# Patient Record
Sex: Female | Born: 2000
Health system: Southern US, Community
[De-identification: ages and names within clinical notes are randomized; demographics above are authoritative.]

## PROBLEM LIST (undated history)

## (undated) DIAGNOSIS — F329 Major depressive disorder, single episode, unspecified: Secondary | ICD-10-CM

## (undated) DIAGNOSIS — K219 Gastro-esophageal reflux disease without esophagitis: Secondary | ICD-10-CM

## (undated) DIAGNOSIS — J329 Chronic sinusitis, unspecified: Secondary | ICD-10-CM

## (undated) DIAGNOSIS — F32A Depression, unspecified: Secondary | ICD-10-CM

## (undated) DIAGNOSIS — R1310 Dysphagia, unspecified: Secondary | ICD-10-CM

## (undated) DIAGNOSIS — K589 Irritable bowel syndrome without diarrhea: Secondary | ICD-10-CM

## (undated) DIAGNOSIS — T7840XA Allergy, unspecified, initial encounter: Secondary | ICD-10-CM

## (undated) DIAGNOSIS — L309 Dermatitis, unspecified: Secondary | ICD-10-CM

## (undated) DIAGNOSIS — G43909 Migraine, unspecified, not intractable, without status migrainosus: Secondary | ICD-10-CM

## (undated) DIAGNOSIS — F0781 Postconcussional syndrome: Secondary | ICD-10-CM

## (undated) DIAGNOSIS — R51 Headache: Secondary | ICD-10-CM

## (undated) DIAGNOSIS — F419 Anxiety disorder, unspecified: Secondary | ICD-10-CM

## (undated) HISTORY — DX: Major depressive disorder, single episode, unspecified: F32.9

## (undated) HISTORY — DX: Depression, unspecified: F32.A

## (undated) HISTORY — DX: Dermatitis, unspecified: L30.9

## (undated) HISTORY — DX: Anxiety disorder, unspecified: F41.9

## (undated) HISTORY — DX: Chronic sinusitis, unspecified: J32.9

## (undated) HISTORY — DX: Postconcussional syndrome: F07.81

---
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
## 2019-03-10 ENCOUNTER — Other Ambulatory Visit: Payer: Self-pay | Admitting: Obstetrics & Gynecology

## 2019-05-01 ENCOUNTER — Emergency Department (INDEPENDENT_AMBULATORY_CARE_PROVIDER_SITE_OTHER)
Admission: EM | Admit: 2019-05-01 | Discharge: 2019-05-01 | Disposition: A | Discharge status: Home / Self Care | Payer: 59 | Source: Home / Self Care | Attending: Emergency Medicine | Admitting: Emergency Medicine

## 2019-05-01 ENCOUNTER — Encounter: Payer: Self-pay | Admitting: *Deleted

## 2019-05-01 ENCOUNTER — Other Ambulatory Visit: Payer: Self-pay

## 2019-05-01 DIAGNOSIS — H65111 Acute and subacute allergic otitis media (mucoid) (sanguinous) (serous), right ear: Secondary | ICD-10-CM

## 2019-05-01 DIAGNOSIS — J0101 Acute recurrent maxillary sinusitis: Secondary | ICD-10-CM | POA: Diagnosis not present

## 2019-05-01 MED ORDER — AMOXICILLIN-POT CLAVULANATE 875-125 MG PO TABS: 1.0000 | ORAL_TABLET | Freq: Two times a day (BID) | ORAL | 0 refills | Status: DC

## 2019-05-01 MED ORDER — FLUTICASONE PROPIONATE 50 MCG/ACT NA SUSP: NASAL | 0 refills | Status: DC

## 2019-05-01 NOTE — Discharge Instructions (Signed)
You have a sinus infection and the beginning of right ear infection.  Also related to seasonal allergies. I prescribed Augmentin which is a combination amoxicillin and clavulanate as antibiotic for the sinus and ear infection. Also prescribed Flonase 1 to 2 sprays each nostril twice a day.  Okay to continue Allegra and Sudafed over-the-counter if needed. Follow-up pcp in 10 to 14 days if no better, sooner if worse. Please read attached instruction sheet on sinusitis

## 2019-05-01 NOTE — ED Triage Notes (Signed)
Pt c/o bilateral ear ache and nasal congestion x 1 wk. She has taken Allegra and Sudafed.

## 2019-05-01 NOTE — ED Provider Notes (Signed)
[facility]    CSN: [id number] Arrival date & time: 05/01/19  1438      History   Chief Complaint Chief Complaint  Patient presents with   Otalgia   Nasal Congestion    HPI Tanya Pham is a 18 y.o. female.   HPI  SINUSITIS  Onset: 14 days Facial/sinus pressure with discolored nasal mucus.    Severity: moderate,Worsening Tried OTC meds without significant relief.  Symptoms:  + Fever  + URI prodrome with nasal congestion + Minimal swollen neck glands + mild Sinus Headache + R ear pressure  + Season Allergy symptoms No significant Sore Throat No eye symptoms     No significant Cough No chest pain No shortness of breath  No wheezing  No Abdominal Pain No Nausea No Vomiting No diarrhea  No Myalgias No focal neurologic symptoms No syncope No Rash  No Urinary symptoms  Remainder of Review of Systems negative except as noted in the HPI.  Past Medical History:  Diagnosis Date   Allergy    seasonal   Anxiety    Depression    Dysphagia    trouble swallowing last 2 months   Eczema    childhood   GERD (gastroesophageal reflux disease)    Headache(784.0)    Migraines    Post concussive syndrome    Sinusitis     Patient Active Problem List   Diagnosis Date Noted   Chronic tension-type headache, not intractable 06/02/2014   Postconcussional syndrome 03/11/2014   Dizzy spells 03/11/2014   Headache(784.0) 03/09/2014   Dysphagia, unspecified(787.20) 01/26/2014   Chest pain 01/15/2014   Epigastric pain 01/15/2014    Past Surgical History:  Procedure Laterality Date   BALLOON DILATION N/A 02/06/2014   Procedure: BALLOON DILATION;  Surgeon: Iva Booparl E [name], MD;  Location: [facility];  Service: Endoscopy;  Laterality: N/A;   ESOPHAGOGASTRODUODENOSCOPY N/A 02/06/2014   Procedure: ESOPHAGOGASTRODUODENOSCOPY (EGD);  Surgeon: Iva Booparl E [name], MD;  [facility];  Service: Endoscopy;  Laterality: N/A;    OB  History    Gravida  0   Para  0   Term  0   Preterm  0   AB  0   Living  0     SAB  0   TAB  0   Ectopic  0   Multiple  0   Live Births  0            Home Medications    Prior to Admission medications   Medication Sig Start Date End Date Taking? Authorizing Provider  colestipol (COLESTID) 1 g tablet Take by mouth. 02/25/19  Yes [provider]  amoxicillin-clavulanate (AUGMENTIN) 875-125 MG tablet Take 1 tablet by mouth 2 (two) times daily. For 10 days. Take with food. 05/01/19   Lajean ManesMassey, Marili Vader, MD  esomeprazole (NEXIUM) 20 MG capsule Take 20 mg by mouth daily at 12 noon.    [provider]  fluticasone Aleda Grana(FLONASE) 50 MCG/ACT nasal spray 1 or 2 sprays each nostril twice a day 05/01/19   Lajean ManesMassey, Shaiden Aldous, MD  loperamide (IMODIUM) 2 MG capsule Take 2 mg by mouth as needed for diarrhea or loose stools.    [provider]  Norethindrone Acetate-Ethinyl Estrad-FE (BLISOVI 24 FE) 1-20 MG-MCG(24) tablet Take 1 tablet by mouth daily.    [provider]  ondansetron (ZOFRAN) 4 MG tablet Take one-two tablets every 8 hours as needed for diarrhea 03/20/18   Esterwood, Amy S, PA-C  OVER THE COUNTER MEDICATION  Lactaid tablets, as needed.    [provider]  OVER THE COUNTER MEDICATION Take 1 tablet by mouth daily.     [provider]    Family History Family History  Problem Relation Age of Onset   Diabetes Mother    Lymphoma Father    Migraines Maternal Grandmother    Esophageal cancer Neg Hx    Colon cancer Neg Hx    Stomach cancer Neg Hx    Liver cancer Neg Hx    Pancreatic cancer Neg Hx    Rectal cancer Neg Hx     Social History Social History   Tobacco Use   Smoking status: Never Smoker   Smokeless tobacco: Never Used  Substance Use Topics   Alcohol use: No   Drug use: No     Allergies   Patient has no known allergies.   Review of Systems Review of Systems  All other systems reviewed and are  negative.    Physical Exam Triage Vital Signs ED Triage Vitals  Enc Vitals Group     BP 05/01/19 1518 111/72     Pulse Rate 05/01/19 1518 85     Resp 05/01/19 1518 16     Temp 05/01/19 1518 98.4 F (36.9 C)     Temp Source 05/01/19 1518 Oral     SpO2 05/01/19 1518 99 %     Weight 05/01/19 1519 160 lb (72.6 kg)     Height 05/01/19 1519 5\' 7"  (1.702 m)     Head Circumference --      Peak Flow --      Pain Score 05/01/19 1519 0     Pain Loc --      Pain Edu? --      Excl. in [place]? --    No data found.  Updated Vital Signs BP 111/72 (BP Location: Right Arm)    Pulse 85    Temp 98.4 F (36.9 C) (Oral)    Resp 16    Ht 5\' 7"  (1.702 m)    Wt 72.6 kg    LMP 04/10/2019    SpO2 99%    BMI 25.06 kg/m   Visual Acuity Right Eye Distance:   Left Eye Distance:   Bilateral Distance:    Right Eye Near:   Left Eye Near:    Bilateral Near:     Physical Exam Vitals signs and nursing note reviewed.  Constitutional:      General: She is not in acute distress.    Appearance: She is well-developed.  HENT:     Head: Normocephalic and atraumatic.     Right Ear: Ear canal and external ear normal. Tympanic membrane is erythematous and retracted. Tympanic membrane is not perforated.     Left Ear: Tympanic membrane, ear canal and external ear normal.     Nose: Mucosal edema and rhinorrhea present.     Right Sinus: Maxillary sinus tenderness present.     Left Sinus: Maxillary sinus tenderness present.     Mouth/Throat:     Mouth: No oral lesions.     Pharynx: No oropharyngeal exudate.  Eyes:     General: No scleral icterus.       Right eye: No discharge.        Left eye: No discharge.     Conjunctiva/sclera: Conjunctivae normal.  Neck:     Musculoskeletal: Neck supple.  Cardiovascular:     Rate and Rhythm: Normal rate and regular rhythm.     Heart sounds:  Normal heart sounds.  Pulmonary:     Effort: Pulmonary effort is normal.     Breath sounds: Normal breath sounds. No wheezing or  rales.  Lymphadenopathy:     Cervical: No cervical adenopathy.  Skin:    General: Skin is warm and dry.  Neurological:     Mental Status: She is alert and oriented to person, place, and time.      UC Treatments / Results  Labs (all labs ordered are listed, but only abnormal results are displayed) Labs Reviewed - No data to display  EKG   Radiology No results found.  Procedures Procedures (including critical care time)  Medications Ordered in UC Medications - No data to display  Initial Impression / Assessment and Plan / UC Course  I have reviewed the triage vital signs and the nursing notes.  Pertinent labs & imaging results that were available during my care of the patient were reviewed by me and considered in my medical decision making (see chart for details).      Final Clinical Impressions(s) / UC Diagnoses   Final diagnoses:  Acute recurrent maxillary sinusitis  Acute mucoid otitis media of right ear     Discharge Instructions     You have a sinus infection and the beginning of right ear infection.  Also related to seasonal allergies. I prescribed Augmentin which is a combination amoxicillin and clavulanate as antibiotic for the sinus and ear infection. Also prescribed Flonase 1 to 2 sprays each nostril twice a day.  Okay to continue Allegra and Sudafed over-the-counter if needed. Follow-up pcp in 10 to 14 days if no better, sooner if worse. Please read attached instruction sheet on sinusitis    ED Prescriptions    Medication Sig Dispense Auth. Provider   amoxicillin-clavulanate (AUGMENTIN) 875-125 MG tablet Take 1 tablet by mouth 2 (two) times daily. For 10 days. Take with food. 20 tablet Jacqulyn Cane, MD   fluticasone [facility]) 50 MCG/ACT nasal spray 1 or 2 sprays each nostril twice a day 16 g Jacqulyn Cane, MD     Controlled Substance Prescriptions [place] Controlled Substance Registry consulted? Not Applicable   Jacqulyn Cane, MD 05/01/19 1950

## 2019-10-29 ENCOUNTER — Emergency Department (INDEPENDENT_AMBULATORY_CARE_PROVIDER_SITE_OTHER)
Admission: EM | Admit: 2019-10-29 | Discharge: 2019-10-29 | Disposition: A | Discharge status: Home / Self Care | Payer: 59 | Source: Home / Self Care

## 2019-10-29 ENCOUNTER — Encounter: Payer: Self-pay | Admitting: Emergency Medicine

## 2019-10-29 ENCOUNTER — Other Ambulatory Visit: Payer: Self-pay

## 2019-10-29 DIAGNOSIS — R0981 Nasal congestion: Secondary | ICD-10-CM

## 2019-10-29 DIAGNOSIS — R519 Headache, unspecified: Secondary | ICD-10-CM

## 2019-10-29 MED ORDER — PREDNISONE 20 MG PO TABS: ORAL_TABLET | ORAL | 0 refills | Status: DC

## 2019-10-29 NOTE — ED Triage Notes (Signed)
C/o sinus pressure, bilat ear pain and "swimmy head" x 3 days No relief w/ claritan or excedrin @ 1330 Uses neti-pot at home Work had requested a rapid covid screen - done at "[facility]" in [place] -done today - negative per pt Pt had covid 08/01/19

## 2019-10-29 NOTE — Discharge Instructions (Signed)
  Please take the prednisone as prescribed to help with inflammation in your sinuses and help with pain.  Stop taking sudafed as it can cause dizziness and dry out your sinuses too much, worsening headaches.  You may take 500mg  acetaminophen every 4-6 hours or in combination with ibuprofen 400-600mg  every 6-8 hours as needed for pain, inflammation, and fever.  Be sure to well hydrated with clear liquids and get at least 8 hours of sleep at night, preferably more while sick.   Please follow up with family medicine in 1 week if needed.

## 2019-10-29 NOTE — ED Provider Notes (Signed)
[facility]    CSN: [id number] Arrival date & time: 10/29/19  1610      History   Chief Complaint Chief Complaint  Patient presents with  . Headache  . Facial Pain  . Otalgia    bilat    HPI Tanya Pham is a 19 y.o. female.   HPI  Tanya Pham is a 19 y.o. female presenting to UC with c/o 3 days of sinus congestion, sinus pressure, bilateral ear fullness and temporal HA.  She also reports feeling "swimmy headed" at times.  Work asked her to leave early this morning to get a Covid test.  She was seen at a different urgent care. The rapid test was negative so she went back to work.  She has to look at a computer at work, which made her headache a lot worse so she went home.  She has tried sudafed and excedrin w/o relief of HA.  She also uses a generic form of Flonase and neti pots.  Denies fever, chills, n/v/d. No known sick contacts. She did test positive for Covid 08/01/2019.  Symptoms had resolved completely from that illness.  She has done well with prednisone in the past for sinus headaches.    Past Medical History:  Diagnosis Date  . Allergy    seasonal  . Anxiety   . Depression   . Dysphagia    trouble swallowing last 2 months  . Eczema    childhood  . GERD (gastroesophageal reflux disease)   . Headache(784.0)   . Migraines   . Post concussive syndrome   . Sinusitis     Patient Active Problem List   Diagnosis Date Noted  . Chronic tension-type headache, not intractable 06/02/2014  . Postconcussional syndrome 03/11/2014  . Dizzy spells 03/11/2014  . Headache(784.0) 03/09/2014  . Dysphagia, unspecified(787.20) 01/26/2014  . Chest pain 01/15/2014  . Epigastric pain 01/15/2014    Past Surgical History:  Procedure Laterality Date  . BALLOON DILATION N/A 02/06/2014   Procedure: BALLOON DILATION;  Surgeon: Iva Boop, MD;  Location: [facility];  Service: Endoscopy;  Laterality: N/A;  . ESOPHAGOGASTRODUODENOSCOPY N/A 02/06/2014   Procedure: ESOPHAGOGASTRODUODENOSCOPY (EGD);  Surgeon: Iva Boop, MD;  [facility];  Service: Endoscopy;  Laterality: N/A;    OB History    Gravida  0   Para  0   Term  0   Preterm  0   AB  0   Living  0     SAB  0   TAB  0   Ectopic  0   Multiple  0   Live Births  0            Home Medications    Prior to Admission medications   Medication Sig Start Date End Date Taking? Authorizing Provider  fluticasone (FLONASE) 50 MCG/ACT nasal spray 1 or 2 sprays each nostril twice a day 05/01/19  Yes Lajean Manes, MD  pantoprazole (PROTONIX) 40 MG tablet Take by mouth. 10/17/19  Yes [provider]  amoxicillin-clavulanate (AUGMENTIN) 875-125 MG tablet Take 1 tablet by mouth 2 (two) times daily. For 10 days. Take with food. 05/01/19   Lajean Manes, MD  colestipol (COLESTID) 1 g tablet Take by mouth. 02/25/19   [provider]  esomeprazole (NEXIUM) 20 MG capsule Take 20 mg by mouth daily at 12 noon.    [provider]  loperamide (IMODIUM) 2 MG capsule Take 2 mg by mouth as needed for diarrhea  or loose stools.    [provider]  Norethindrone Acetate-Ethinyl Estrad-FE (BLISOVI 24 FE) 1-20 MG-MCG(24) tablet Take 1 tablet by mouth daily.    [provider]  ondansetron (ZOFRAN) 4 MG tablet Take one-two tablets every 8 hours as needed for diarrhea 03/20/18   Esterwood, Amy S, PA-C  OVER THE COUNTER MEDICATION Lactaid tablets, as needed.    [provider]  OVER THE COUNTER MEDICATION Take 1 tablet by mouth daily.     [provider]  predniSONE (DELTASONE) 20 MG tablet 3 tabs po day one, then 2 po daily x 4 days 10/29/19   Noe Gens, PA-C    Family History Family History  Problem Relation Age of Onset  . Diabetes Mother   . Lymphoma Father   . Migraines Maternal Grandmother   . Esophageal cancer Neg Hx   . Colon cancer Neg Hx   . Stomach cancer Neg Hx   . Liver cancer Neg Hx   . Pancreatic  cancer Neg Hx   . Rectal cancer Neg Hx     Social History Social History   Tobacco Use  . Smoking status: Never Smoker  . Smokeless tobacco: Never Used  Substance Use Topics  . Alcohol use: No  . Drug use: No     Allergies   Patient has no known allergies.   Review of Systems Review of Systems  Constitutional: Negative for chills and fever.  HENT: Positive for congestion, ear pain ( fullness) and sinus pressure. Negative for sore throat, trouble swallowing and voice change.   Respiratory: Negative for cough and shortness of breath.   Cardiovascular: Negative for chest pain and palpitations.  Gastrointestinal: Negative for abdominal pain, diarrhea, nausea and vomiting.  Musculoskeletal: Negative for arthralgias, back pain and myalgias.  Skin: Negative for rash.  Neurological: Positive for headaches. Negative for dizziness and light-headedness.     Physical Exam Triage Vital Signs ED Triage Vitals  Enc Vitals Group     BP 10/29/19 1626 132/84     Pulse Rate 10/29/19 1626 94     Resp 10/29/19 1626 17     Temp 10/29/19 1626 99 F (37.2 C)     Temp Source 10/29/19 1626 Oral     SpO2 10/29/19 1626 99 %     Weight 10/29/19 1628 155 lb (70.3 kg)     Height 10/29/19 1628 5\' 7"  (1.702 m)     Head Circumference --      Peak Flow --      Pain Score 10/29/19 1627 7     Pain Loc --      Pain Edu? --      Excl. in [place]? --    No data found.  Updated Vital Signs BP 132/84 (BP Location: Left Arm)   Pulse 94   Temp 99 F (37.2 C) (Oral)   Resp 17   Ht 5\' 7"  (1.702 m)   Wt 155 lb (70.3 kg)   LMP 10/15/2019 (Approximate)   SpO2 99%   BMI 24.28 kg/m   Visual Acuity Right Eye Distance:   Left Eye Distance:   Bilateral Distance:    Right Eye Near:   Left Eye Near:    Bilateral Near:     Physical Exam Vitals and nursing note reviewed.  Constitutional:      General: She is not in acute distress.    Appearance: She is well-developed. She is not ill-appearing,  toxic-appearing or diaphoretic.  HENT:  Head: Normocephalic and atraumatic.     Right Ear: Tympanic membrane and ear canal normal.     Left Ear: Tympanic membrane and ear canal normal.     Nose: Mucosal edema present.     Right Sinus: No maxillary sinus tenderness or frontal sinus tenderness.     Left Sinus: No maxillary sinus tenderness or frontal sinus tenderness.     Comments: Mild tenderness to temporal sides of head but not over temporal arteries. Arteries are soft, non-pulsating     Mouth/Throat:     Lips: Pink.     Mouth: Mucous membranes are moist.     Pharynx: Oropharynx is clear. Uvula midline.  Eyes:     Extraocular Movements: Extraocular movements intact.     Pupils: Pupils are equal, round, and reactive to light.  Cardiovascular:     Rate and Rhythm: Normal rate and regular rhythm.  Pulmonary:     Effort: Pulmonary effort is normal. No respiratory distress.     Breath sounds: Normal breath sounds. No stridor. No wheezing, rhonchi or rales.  Musculoskeletal:        General: Normal range of motion.     Cervical back: Normal range of motion and neck supple.  Skin:    General: Skin is warm and dry.  Neurological:     Mental Status: She is alert and oriented to person, place, and time.     Cranial Nerves: No cranial nerve deficit.  Psychiatric:        Mood and Affect: Mood normal.        Behavior: Behavior normal.      UC Treatments / Results  Labs (all labs ordered are listed, but only abnormal results are displayed) Labs Reviewed - No data to display  EKG   Radiology No results found.  Procedures Procedures (including critical care time)  Medications Ordered in UC Medications - No data to display  Initial Impression / Assessment and Plan / UC Course  I have reviewed the triage vital signs and the nursing notes.  Pertinent labs & imaging results that were available during my care of the patient were reviewed by me and considered in my medical  decision making (see chart for details).      No evidence of bacterial infection at this time. Encouraged symptomatic tx Pt states she has done well with prednisone in the past Encouraged good hydration and rest.  AVS and work note provided  Final Clinical Impressions(s) / UC Diagnoses   Final diagnoses:  Bad headache  Sinus congestion     Discharge Instructions      Please take the prednisone as prescribed to help with inflammation in your sinuses and help with pain.  Stop taking sudafed as it can cause dizziness and dry out your sinuses too much, worsening headaches.  You may take 500mg  acetaminophen every 4-6 hours or in combination with ibuprofen 400-600mg  every 6-8 hours as needed for pain, inflammation, and fever.  Be sure to well hydrated with clear liquids and get at least 8 hours of sleep at night, preferably more while sick.   Please follow up with family medicine in 1 week if needed.     ED Prescriptions    Medication Sig Dispense Auth. Provider   predniSONE (DELTASONE) 20 MG tablet 3 tabs po day one, then 2 po daily x 4 days 11 tablet , PA-C     PDMP not reviewed this encounter.   Lurene Shadow, Lurene Shadow 10/29/19 [id number]

## 2020-03-09 ENCOUNTER — Other Ambulatory Visit: Payer: Self-pay

## 2020-03-09 ENCOUNTER — Emergency Department (INDEPENDENT_AMBULATORY_CARE_PROVIDER_SITE_OTHER)
Admission: EM | Admit: 2020-03-09 | Discharge: 2020-03-09 | Disposition: A | Discharge status: Home / Self Care | Payer: 59 | Source: Home / Self Care

## 2020-03-09 ENCOUNTER — Encounter: Payer: Self-pay | Admitting: Emergency Medicine

## 2020-03-09 DIAGNOSIS — H65113 Acute and subacute allergic otitis media (mucoid) (sanguinous) (serous), bilateral: Secondary | ICD-10-CM | POA: Diagnosis not present

## 2020-03-09 MED ORDER — AMOXICILLIN-POT CLAVULANATE 875-125 MG PO TABS: 1.0000 | ORAL_TABLET | Freq: Two times a day (BID) | ORAL | 0 refills | Status: DC

## 2020-03-09 NOTE — ED Triage Notes (Signed)
Bilateral ear pain x 2 weeks -intermittent Min relief w/ allegra-D (last dose 1 week ago Pain worse on Right side  Pt has some mild sinus pressure  No other OTC meds No COVID vaccine

## 2020-03-09 NOTE — ED Provider Notes (Signed)
[facility]    CSN: [id number] Arrival date & time: 03/09/20  1054      History   Chief Complaint Chief Complaint  Patient presents with  . Otalgia    bilateral    HPI Tanya Pham is a 19 y.o. female.   Patient has a history of ear congestion and discomfort.  This is been a chronic problem.  She is also has some sinus pain and pressure.  HPI  Past Medical History:  Diagnosis Date  . Allergy    seasonal  . Anxiety   . Depression   . Dysphagia    trouble swallowing last 2 months  . Eczema    childhood  . GERD (gastroesophageal reflux disease)   . Headache(784.0)   . Migraines   . Post concussive syndrome   . Sinusitis     Patient Active Problem List   Diagnosis Date Noted  . Chronic tension-type headache, not intractable 06/02/2014  . Postconcussional syndrome 03/11/2014  . Dizzy spells 03/11/2014  . Headache(784.0) 03/09/2014  . Dysphagia, unspecified(787.20) 01/26/2014  . Chest pain 01/15/2014  . Epigastric pain 01/15/2014    Past Surgical History:  Procedure Laterality Date  . BALLOON DILATION N/A 02/06/2014   Procedure: BALLOON DILATION;  Surgeon: Iva Boop, MD;  Location: [facility];  Service: Endoscopy;  Laterality: N/A;  . ESOPHAGOGASTRODUODENOSCOPY N/A 02/06/2014   Procedure: ESOPHAGOGASTRODUODENOSCOPY (EGD);  Surgeon: Iva Boop, MD;  [facility];  Service: Endoscopy;  Laterality: N/A;    OB History    Gravida  0   Para  0   Term  0   Preterm  0   AB  0   Living  0     SAB  0   TAB  0   Ectopic  0   Multiple  0   Live Births  0            Home Medications    Prior to Admission medications   Medication Sig Start Date End Date Taking? Authorizing Provider  esomeprazole (NEXIUM) 20 MG capsule Take 20 mg by mouth daily at 12 noon.   Yes [provider]  pantoprazole (PROTONIX) 40 MG tablet Take by mouth. 10/17/19  Yes [provider]  amoxicillin-clavulanate  (AUGMENTIN) 875-125 MG tablet Take 1 tablet by mouth every 12 (twelve) hours. 03/09/20   Frederica Kuster, MD  amoxicillin-clavulanate (AUGMENTIN) 875-125 MG tablet Take 1 tablet by mouth 2 (two) times daily. For 10 days. Take with food. 03/09/20   Frederica Kuster, MD  colestipol (COLESTID) 1 g tablet Take by mouth. 02/25/19   [provider]  etonogestrel (NEXPLANON) 68 MG IMPL implant Inject into the skin.    [provider]  fluticasone Aleda Grana) 50 MCG/ACT nasal spray 1 or 2 sprays each nostril twice a day 05/01/19   Lajean Manes, MD  loperamide (IMODIUM) 2 MG capsule Take 2 mg by mouth as needed for diarrhea or loose stools.    [provider]  Norethindrone Acetate-Ethinyl Estrad-FE (BLISOVI 24 FE) 1-20 MG-MCG(24) tablet Take 1 tablet by mouth daily.    [provider]  ondansetron (ZOFRAN) 4 MG tablet Take one-two tablets every 8 hours as needed for diarrhea 03/20/18   Esterwood, Amy S, PA-C  OVER THE COUNTER MEDICATION Lactaid tablets, as needed.    [provider]  OVER THE COUNTER MEDICATION Take 1 tablet by mouth daily.     [provider]  predniSONE (DELTASONE) 20 MG  tablet 3 tabs po day one, then 2 po daily x 4 days 10/29/19   Lurene Shadow, PA-C    Family History Family History  Problem Relation Age of Onset  . Diabetes Mother   . Lymphoma Father   . Migraines Maternal Grandmother   . Esophageal cancer Neg Hx   . Colon cancer Neg Hx   . Stomach cancer Neg Hx   . Liver cancer Neg Hx   . Pancreatic cancer Neg Hx   . Rectal cancer Neg Hx     Social History Social History   Tobacco Use  . Smoking status: Never Smoker  . Smokeless tobacco: Never Used  Vaping Use  . Vaping Use: Never used  Substance Use Topics  . Alcohol use: No  . Drug use: No     Allergies   Patient has no known allergies.   Review of Systems Review of Systems  HENT: Positive for congestion, ear pain, sinus pressure and sinus pain.     Respiratory: Negative.   Cardiovascular: Negative.   All other systems reviewed and are negative.    Physical Exam Triage Vital Signs ED Triage Vitals  Enc Vitals Group     BP 03/09/20 1151 118/83     Pulse Rate 03/09/20 1151 75     Resp 03/09/20 1151 17     Temp 03/09/20 1151 99.1 F (37.3 C)     Temp Source 03/09/20 1151 Oral     SpO2 03/09/20 1151 100 %     Weight --      Height --      Head Circumference --      Peak Flow --      Pain Score 03/09/20 1154 4     Pain Loc --      Pain Edu? --      Excl. in [place]? --    No data found.  Updated Vital Signs BP 118/83 (BP Location: Right Arm)   Pulse 75   Temp 99.1 F (37.3 C) (Oral)   Resp 17   LMP 03/08/2020 (Exact Date) Comment: implant 2019  SpO2 100%   Visual Acuity Right Eye Distance:   Left Eye Distance:   Bilateral Distance:    Right Eye Near:   Left Eye Near:    Bilateral Near:     Physical Exam Vitals and nursing note reviewed.  Constitutional:      Appearance: Normal appearance.  HENT:     Head: Normocephalic.     Ears:     Comments: Both TMs are dull and do not move with Valsalva maneuver    Nose: Nose normal.     Mouth/Throat:     Mouth: Mucous membranes are moist.  Cardiovascular:     Rate and Rhythm: Normal rate and regular rhythm.  Pulmonary:     Effort: Pulmonary effort is normal.     Breath sounds: Normal breath sounds.  Neurological:     General: No focal deficit present.     Mental Status: She is alert and oriented to person, place, and time.      UC Treatments / Results  Labs (all labs ordered are listed, but only abnormal results are displayed) Labs Reviewed - No data to display  EKG   Radiology No results found.  Procedures Procedures (including critical care time)  Medications Ordered in UC Medications - No data to display  Initial Impression / Assessment and Plan / UC Course  I have reviewed the triage vital signs  and the nursing notes.  Pertinent labs &  imaging results that were available during my care of the patient were reviewed by me and considered in my medical decision making (see chart for details).     Bilateral serous effusion and possible sinusitis Final Clinical Impressions(s) / UC Diagnoses   Final diagnoses:  Acute mucoid otitis media of both ears     Discharge Instructions     Do ear exercises as discussed Use Flonase and both sides of nose once a day   ED Prescriptions    Medication Sig Dispense Auth. Provider   amoxicillin-clavulanate (AUGMENTIN) 875-125 MG tablet Take 1 tablet by mouth every 12 (twelve) hours. 14 tablet Frederica Kuster, MD   amoxicillin-clavulanate (AUGMENTIN) 875-125 MG tablet Take 1 tablet by mouth 2 (two) times daily. For 10 days. Take with food. 20 tablet Frederica Kuster, MD     PDMP not reviewed this encounter.   Frederica Kuster, MD 03/09/20 1220

## 2020-03-09 NOTE — Discharge Instructions (Addendum)
Do ear exercises as discussed Use Flonase and both sides of nose once a day

## 2020-04-14 ENCOUNTER — Other Ambulatory Visit: Payer: Self-pay

## 2020-04-14 ENCOUNTER — Ambulatory Visit (INDEPENDENT_AMBULATORY_CARE_PROVIDER_SITE_OTHER): Payer: 59 | Admitting: Obstetrics and Gynecology

## 2020-04-14 ENCOUNTER — Encounter: Payer: Self-pay | Admitting: Obstetrics and Gynecology

## 2020-04-14 VITALS — BP 104/64 | HR 90 | Resp 16 | Ht 67.0 in | Wt 184.0 lb

## 2020-04-14 DIAGNOSIS — Z30017 Encounter for initial prescription of implantable subdermal contraceptive: Secondary | ICD-10-CM | POA: Diagnosis not present

## 2020-04-14 DIAGNOSIS — Z3046 Encounter for surveillance of implantable subdermal contraceptive: Secondary | ICD-10-CM

## 2020-04-14 MED ORDER — ETONOGESTREL 68 MG ~~LOC~~ IMPL
68.0000 mg | DRUG_IMPLANT | Freq: Once | SUBCUTANEOUS | Status: AC
  Administered 2020-04-14: 68 mg via SUBCUTANEOUS

## 2020-04-14 NOTE — Progress Notes (Signed)
     GYNECOLOGY OFFICE PROCEDURE NOTE  Tanya Pham is a 19 y.o. G0P0000 here for Nexplanon removal and Nexplanon re- insertion.  Last pap smear was on NA- age.  No other gynecologic concerns.  Nexplanon Removal and Reinsertion Patient identified, informed consent performed, consent signed.   Patient does understand that irregular bleeding is a very common side effect of this medication. She was advised to have backup contraception for one week after replacement of the implant. Pregnancy test in clinic today was negative.  Appropriate time out taken. Nexplanon site identified in left arm.  Area prepped in usual sterile fashon. One ml of 1% lidocaine was used to anesthetize the area at the distal end of the implant. A small stab incision was made right beside the implant on the distal portion. The Nexplanon rod was grasped using hemostats and removed without difficulty. There was minimal blood loss. There were no complications. Area was then injected with 3 ml of 1 % lidocaine. She was re-prepped with betadine, Nexplanon removed from packaging, Device confirmed in needle, then inserted full length of needle and withdrawn per handbook instructions. Nexplanon was able to palpated in the patient's arm; patient palpated the insert herself.  There was minimal blood loss. Patient insertion site covered with gauze and a pressure bandage to reduce any bruising. The patient tolerated the procedure well and was given post procedure instructions.  She was advised to have backup contraception for one week.       Delesha Pohlman, Harolyn Rutherford, NP [facility] [employer], [facility]

## 2020-04-15 DIAGNOSIS — Z3046 Encounter for surveillance of implantable subdermal contraceptive: Secondary | ICD-10-CM | POA: Insufficient documentation

## 2020-06-18 ENCOUNTER — Telehealth: Payer: Self-pay

## 2020-06-18 NOTE — Telephone Encounter (Signed)
Patient left message stating that she just got another Nexplanon placed on 05/15/20 and in the past has had excessive bleeding in which she was prescribed medication.  Now that she has had the Nexplanon reinserted she is having the same issue.  Can someone call her back?  Addison Naegeli, RN

## 2020-06-21 ENCOUNTER — Emergency Department (INDEPENDENT_AMBULATORY_CARE_PROVIDER_SITE_OTHER)
Admission: RE | Admit: 2020-06-21 | Discharge: 2020-06-21 | Disposition: A | Discharge status: Home / Self Care | Payer: 59 | Source: Ambulatory Visit | Attending: Family Medicine | Admitting: Family Medicine

## 2020-06-21 ENCOUNTER — Other Ambulatory Visit: Payer: Self-pay

## 2020-06-21 VITALS — BP 135/97 | HR 88 | Temp 98.2°F | Resp 18

## 2020-06-21 DIAGNOSIS — N3001 Acute cystitis with hematuria: Secondary | ICD-10-CM

## 2020-06-21 DIAGNOSIS — R300 Dysuria: Secondary | ICD-10-CM

## 2020-06-21 HISTORY — DX: Irritable bowel syndrome, unspecified: K58.9

## 2020-06-21 LAB — POCT URINALYSIS DIP (MANUAL ENTRY)
Bilirubin, UA: NEGATIVE
Glucose, UA: NEGATIVE mg/dL
Ketones, POC UA: NEGATIVE mg/dL
Nitrite, UA: NEGATIVE
Protein Ur, POC: NEGATIVE mg/dL
Spec Grav, UA: 1.015 (ref 1.010–1.025)
Urobilinogen, UA: 0.2 E.U./dL
pH, UA: 5 (ref 5.0–8.0)

## 2020-06-21 MED ORDER — CEPHALEXIN 500 MG PO CAPS: 500.0000 mg | ORAL_CAPSULE | Freq: Two times a day (BID) | ORAL | 0 refills | Status: AC

## 2020-06-21 NOTE — ED Triage Notes (Signed)
Pt presents to Urgent Care with c/o dysuria and frequency since last night. She noted blood in toilet today, but she states she is finishing w/ her menstrual cycle.

## 2020-06-21 NOTE — ED Provider Notes (Signed)
[facility]    CSN: [id number] Arrival date & time: 06/21/20  1400      History   Chief Complaint Chief Complaint  Patient presents with  . Dysuria    HPI Tanya Pham is a 19 y.o. female.   HPI  Tanya Pham is a 19 y.o. female presenting to UC with c/o urinary frequency and burning since late last night.  She noticed blood in the toilet but is also finishing her period.  Denies fever, chills, n/v/d. Lower back pain but also attributes to her period. Hx of UTIs. No vaginal symptoms.    Past Medical History:  Diagnosis Date  . Allergy    seasonal  . Anxiety   . Depression   . Dysphagia    trouble swallowing last 2 months  . Eczema    childhood  . GERD (gastroesophageal reflux disease)   . Headache(784.0)   . IBS (irritable bowel syndrome)   . Migraines   . Post concussive syndrome   . Sinusitis     Patient Active Problem List   Diagnosis Date Noted  . Encounter for removal and reinsertion of Nexplanon 04/15/2020  . Chronic tension-type headache, not intractable 06/02/2014  . Postconcussional syndrome 03/11/2014  . Dizzy spells 03/11/2014  . Headache(784.0) 03/09/2014  . Dysphagia, unspecified(787.20) 01/26/2014  . Chest pain 01/15/2014  . Epigastric pain 01/15/2014    Past Surgical History:  Procedure Laterality Date  . BALLOON DILATION N/A 02/06/2014   Procedure: BALLOON DILATION;  Surgeon: Iva Boop, MD;  Location: [facility];  Service: Endoscopy;  Laterality: N/A;  . ESOPHAGOGASTRODUODENOSCOPY N/A 02/06/2014   Procedure: ESOPHAGOGASTRODUODENOSCOPY (EGD);  Surgeon: Iva Boop, MD;  Location: [facility];  Service: Endoscopy;  Laterality: N/A;    OB History    Gravida  0   Para  0   Term  0   Preterm  0   AB  0   Living  0     SAB  0   TAB  0   Ectopic  0   Multiple  0   Live Births  0            Home Medications    Prior to Admission medications   Medication Sig Start Date End Date Taking?  Authorizing Provider  cephALEXin (KEFLEX) 500 MG capsule Take 1 capsule (500 mg total) by mouth 2 (two) times daily for 7 days. 06/21/20 06/28/20  Lurene Shadow, PA-C  esomeprazole (NEXIUM) 20 MG capsule Take 20 mg by mouth daily at 12 noon.    [provider]  fluticasone Aleda Grana) 50 MCG/ACT nasal spray 1 or 2 sprays each nostril twice a day 05/01/19   Lajean Manes, MD  loperamide (IMODIUM) 2 MG capsule Take 2 mg by mouth as needed for diarrhea or loose stools.    [provider]  ondansetron (ZOFRAN) 4 MG tablet Take one-two tablets every 8 hours as needed for diarrhea 03/20/18   Esterwood, Amy S, PA-C  OVER THE COUNTER MEDICATION Lactaid tablets, as needed. Patient not taking: Reported on 04/14/2020    [provider]  OVER THE COUNTER MEDICATION Take 1 tablet by mouth daily.     [provider]  pantoprazole (PROTONIX) 40 MG tablet Take by mouth. 10/17/19   [provider]    Family History Family History  Problem Relation Age of Onset  . Diabetes Mother   . Lymphoma Father   . Migraines Maternal Grandmother   . Esophageal  cancer Neg Hx   . Colon cancer Neg Hx   . Stomach cancer Neg Hx   . Liver cancer Neg Hx   . Pancreatic cancer Neg Hx   . Rectal cancer Neg Hx     Social History Social History   Tobacco Use  . Smoking status: Never Smoker  . Smokeless tobacco: Never Used  Vaping Use  . Vaping Use: Never used  Substance Use Topics  . Alcohol use: No  . Drug use: No     Allergies   Patient has no known allergies.   Review of Systems Review of Systems  Constitutional: Negative for chills and fever.  Gastrointestinal: Negative for abdominal pain, diarrhea, nausea and vomiting.  Genitourinary: Positive for dysuria, frequency, hematuria and urgency.  Neurological: Negative for dizziness and headaches.     Physical Exam Triage Vital Signs ED Triage Vitals  Enc Vitals Group     BP 06/21/20 1435 (!) 135/97     Pulse Rate  06/21/20 1435 88     Resp 06/21/20 1435 18     Temp 06/21/20 1435 98.2 F (36.8 C)     Temp Source 06/21/20 1435 Oral     SpO2 06/21/20 1435 100 %     Weight --      Height --      Head Circumference --      Peak Flow --      Pain Score 06/21/20 1430 7     Pain Loc --      Pain Edu? --      Excl. in GC? --    No data found.  Updated Vital Signs BP (!) 135/97 (BP Location: Right Arm)   Pulse 88   Temp 98.2 F (36.8 C) (Oral)   Resp 18   LMP 06/11/2020 (Approximate)   SpO2 100%   Visual Acuity Right Eye Distance:   Left Eye Distance:   Bilateral Distance:    Right Eye Near:   Left Eye Near:    Bilateral Near:     Physical Exam Vitals and nursing note reviewed.  Constitutional:      General: She is not in acute distress.    Appearance: Normal appearance. She is well-developed. She is not ill-appearing, toxic-appearing or diaphoretic.  HENT:     Head: Normocephalic and atraumatic.  Cardiovascular:     Rate and Rhythm: Normal rate and regular rhythm.  Pulmonary:     Effort: Pulmonary effort is normal. No respiratory distress.     Breath sounds: Normal breath sounds.  Abdominal:     General: There is no distension.     Palpations: Abdomen is soft.     Tenderness: There is no abdominal tenderness. There is no right CVA tenderness or left CVA tenderness.  Musculoskeletal:        General: Normal range of motion.     Cervical back: Normal range of motion.  Skin:    General: Skin is warm and dry.  Neurological:     Mental Status: She is alert and oriented to person, place, and time.  Psychiatric:        Behavior: Behavior normal.      UC Treatments / Results  Labs (all labs ordered are listed, but only abnormal results are displayed) Labs Reviewed  POCT URINALYSIS DIP (MANUAL ENTRY) - Abnormal; Notable for the following components:      Result Value   Blood, UA moderate (*)    Leukocytes, UA Small (1+) (*)  All other components within normal limits    URINE CULTURE    EKG   Radiology No results found.  Procedures Procedures (including critical care time)  Medications Ordered in UC Medications - No data to display  Initial Impression / Assessment and Plan / UC Course  I have reviewed the triage vital signs and the nursing notes.  Pertinent labs & imaging results that were available during my care of the patient were reviewed by me and considered in my medical decision making (see chart for details).     Hx and UA c/w UTI Culture sent Will start on keflex F/u with PCP AVS given  Final Clinical Impressions(s) / UC Diagnoses   Final diagnoses:  Dysuria  Acute cystitis with hematuria     Discharge Instructions      Please take your antibiotic as prescribed. A urine culture has been sent to check the severity of your urinary infection and to determine if you are on the most appropriate antibiotic. The results should come back within 2-3 days. You will only be notified if a medication change is indicated.  Please follow up with family medicine or urology if not improving within 1 week, sooner if symptoms worsening.      ED Prescriptions    Medication Sig Dispense Auth. Provider   cephALEXin (KEFLEX) 500 MG capsule Take 1 capsule (500 mg total) by mouth 2 (two) times daily for 7 days. 14 capsule Lurene Shadow, [place]     PDMP not reviewed this encounter.   Lurene Shadow, PA-C 06/21/20 2003

## 2020-06-21 NOTE — Discharge Instructions (Signed)
  Please take your antibiotic as prescribed. A urine culture has been sent to check the severity of your urinary infection and to determine if you are on the most appropriate antibiotic. The results should come back within 2-3 days. You will only be notified if a medication change is indicated.  Please follow up with family medicine or urology if not improving within 1 week, sooner if symptoms worsening.   

## 2020-06-22 ENCOUNTER — Ambulatory Visit: Payer: 59 | Admitting: Certified Nurse Midwife

## 2020-06-24 LAB — URINE CULTURE
MICRO NUMBER:: 11146527
SPECIMEN QUALITY:: ADEQUATE

## 2020-06-30 ENCOUNTER — Other Ambulatory Visit: Payer: Self-pay

## 2020-06-30 ENCOUNTER — Emergency Department (INDEPENDENT_AMBULATORY_CARE_PROVIDER_SITE_OTHER)
Admission: EM | Admit: 2020-06-30 | Discharge: 2020-06-30 | Disposition: A | Discharge status: Home / Self Care | Payer: 59 | Source: Home / Self Care | Attending: Family Medicine | Admitting: Family Medicine

## 2020-06-30 DIAGNOSIS — Z9189 Other specified personal risk factors, not elsewhere classified: Secondary | ICD-10-CM

## 2020-06-30 DIAGNOSIS — J069 Acute upper respiratory infection, unspecified: Secondary | ICD-10-CM

## 2020-06-30 MED ORDER — PREDNISONE 20 MG PO TABS: ORAL_TABLET | ORAL | 0 refills | Status: DC

## 2020-06-30 NOTE — ED Triage Notes (Signed)
Pt presents to Urgent Care with c/o cough and nasal congestion x 2 days. Also c/o chills. Afebrile. Onset of N/V this AM. Pt w/ no known COVID exposure; has not been vaccinated.

## 2020-06-30 NOTE — Discharge Instructions (Addendum)
Continue Mucinex-D, with plenty of water, for cough and congestion.  Get adequate rest.   May use Afrin nasal spray (or generic oxymetazoline) each morning for about 5 days and then discontinue.  Also recommend using saline nasal spray several times daily and saline nasal irrigation (AYR is a common brand).  Use Flonase nasal spray each morning after using Afrin nasal spray and saline nasal irrigation. Try warm salt water gargles for sore throat.  Stop all antihistamines for now, and other non-prescription cough/cold preparations. May take Delsym Cough Suppressant ("12 Hour Cough Relief") at bedtime for nighttime cough.   Isolate yourself until COVID-19 test result is available.   If your COVID19 test is positive, then you are infected with the novel coronavirus and could give the virus to others.  Please continue isolation at home for at least 10 days since the start of your symptoms.  Once you complete your 10 day quarantine, you may return to normal activities as long as you've not had a fever for over 24 hours (without taking fever reducing medicine) and your symptoms are improving. Please continue good preventive care measures, including:  frequent hand-washing, avoid touching your face, cover coughs/sneezes, stay out of crowds and keep a 6 foot distance from others.  Go to the nearest hospital emergency room if fever/cough/breathlessness are severe or illness seems like a threat to life.

## 2020-06-30 NOTE — ED Provider Notes (Signed)
[facility]    CSN: [id number] Arrival date & time: 06/30/20  1228      History   Chief Complaint Chief Complaint  Patient presents with  . Cough  . Emesis    HPI Tanya Pham is a 19 y.o. female.   Three days ago patient developed mild sore throat, sinus congestion, and non-productive cough.  She has now developed fatigue, chills/sweats, mild vertigo with nausea, and earache today. She has a history of seasonal rhinitis (spring and fall).  The history is provided by the patient.    Past Medical History:  Diagnosis Date  . Allergy    seasonal  . Anxiety   . Depression   . Dysphagia    trouble swallowing last 2 months  . Eczema    childhood  . GERD (gastroesophageal reflux disease)   . Headache(784.0)   . IBS (irritable bowel syndrome)   . Migraines   . Post concussive syndrome   . Sinusitis     Patient Active Problem List   Diagnosis Date Noted  . Encounter for removal and reinsertion of Nexplanon 04/15/2020  . Chronic tension-type headache, not intractable 06/02/2014  . Postconcussional syndrome 03/11/2014  . Dizzy spells 03/11/2014  . Headache(784.0) 03/09/2014  . Dysphagia, unspecified(787.20) 01/26/2014  . Chest pain 01/15/2014  . Epigastric pain 01/15/2014    Past Surgical History:  Procedure Laterality Date  . BALLOON DILATION N/A 02/06/2014   Procedure: BALLOON DILATION;  Surgeon: Iva Boop, MD;  Location: [facility];  Service: Endoscopy;  Laterality: N/A;  . ESOPHAGOGASTRODUODENOSCOPY N/A 02/06/2014   Procedure: ESOPHAGOGASTRODUODENOSCOPY (EGD);  Surgeon: Iva Boop, MD;  Location: [facility];  Service: Endoscopy;  Laterality: N/A;    OB History    Gravida  0   Para  0   Term  0   Preterm  0   AB  0   Living  0     SAB  0   TAB  0   Ectopic  0   Multiple  0   Live Births  0            Home Medications    Prior to Admission medications   Medication Sig Start Date End Date Taking?  Authorizing Provider  OVER THE COUNTER MEDICATION "De-hist" OTC herbal treatment for cough/cold PRN   Yes [provider]  esomeprazole (NEXIUM) 20 MG capsule Take 20 mg by mouth daily at 12 noon.    [provider]  fluticasone Aleda Grana) 50 MCG/ACT nasal spray 1 or 2 sprays each nostril twice a day 05/01/19   Lajean Manes, MD  loperamide (IMODIUM) 2 MG capsule Take 2 mg by mouth as needed for diarrhea or loose stools.    [provider]  ondansetron (ZOFRAN) 4 MG tablet Take one-two tablets every 8 hours as needed for diarrhea 03/20/18   Esterwood, Amy S, PA-C  OVER THE COUNTER MEDICATION Lactaid tablets, as needed. Patient not taking: Reported on 04/14/2020    [provider]  OVER THE COUNTER MEDICATION Take 1 tablet by mouth daily.     [provider]  pantoprazole (PROTONIX) 40 MG tablet Take by mouth. 10/17/19   [provider]  predniSONE (DELTASONE) 20 MG tablet Take one tab by mouth twice daily for 4 days, then one daily for 3 days. Take with food. 06/30/20   Lattie Haw, MD    Family History Family History  Problem Relation Age of Onset  . Diabetes Mother   .  Lymphoma Father   . Migraines Maternal Grandmother   . Esophageal cancer Neg Hx   . Colon cancer Neg Hx   . Stomach cancer Neg Hx   . Liver cancer Neg Hx   . Pancreatic cancer Neg Hx   . Rectal cancer Neg Hx     Social History Social History   Tobacco Use  . Smoking status: Never Smoker  . Smokeless tobacco: Never Used  Vaping Use  . Vaping Use: Never used  Substance Use Topics  . Alcohol use: No  . Drug use: No     Allergies   Patient has no known allergies.   Review of Systems Review of Systems + sore throat + cough No pleuritic pain but has tightness in her anterior chest No wheezing + nasal congestion + post-nasal drainage No sinus pain/pressure No itchy/red eyes + earache + intermittent dizziness No hemoptysis No SOB No fever, +  chills/sweats + nausea No vomiting No abdominal pain No diarrhea No urinary symptoms No skin rash + fatigue No myalgias + headache Used OTC meds (Allegra, Mucinex D) without relief   Physical Exam Triage Vital Signs ED Triage Vitals  Enc Vitals Group     BP 06/30/20 1250 125/88     Pulse Rate 06/30/20 1250 95     Resp 06/30/20 1250 18     Temp 06/30/20 1250 98.3 F (36.8 C)     Temp src --      SpO2 06/30/20 1250 99 %     Weight --      Height --      Head Circumference --      Peak Flow --      Pain Score 06/30/20 1245 6     Pain Loc --      Pain Edu? --      Excl. in GC? --    No data found.  Updated Vital Signs BP 125/88 (BP Location: Right Arm)   Pulse 95   Temp 98.3 F (36.8 C)   Resp 18   LMP 06/11/2020 (Approximate)   SpO2 99%   Visual Acuity Right Eye Distance:   Left Eye Distance:   Bilateral Distance:    Right Eye Near:   Left Eye Near:    Bilateral Near:     Physical Exam Nursing notes and Vital Signs reviewed. Appearance:  Patient appears stated age, and in no acute distress Eyes:  Pupils are equal, round, and reactive to light and accomodation.  Extraocular movement is intact.  Conjunctivae are not inflamed  Ears:  Canals normal.  Tympanic membranes normal.  Nose:  Mildly congested turbinates.  No sinus tenderness.  Pharynx:  Normal Neck:  Supple.  Mildly enlarged tender lateral nodes are present bilaterally. Lungs:  Clear to auscultation.  Breath sounds are equal.  Moving air well. Heart:  Regular rate and rhythm without murmurs, rubs, or gallops.  Abdomen:  Nontender without masses or hepatosplenomegaly.  Bowel sounds are present.  No CVA or flank tenderness.  Extremities:  No edema.  Skin:  No rash present.   UC Treatments / Results  Labs (all labs ordered are listed, but only abnormal results are displayed) Labs Reviewed  NOVEL CORONAVIRUS, NAA    EKG   Radiology No results found.  Procedures Procedures (including  critical care time)  Medications Ordered in UC Medications - No data to display  Initial Impression / Assessment and Plan / UC Course  I have reviewed the triage vital signs and the  nursing notes.  Pertinent labs & imaging results that were available during my care of the patient were reviewed by me and considered in my medical decision making (see chart for details).    Benign exam.  There is no evidence of bacterial infection today. Because of history of allergic rhinitis, will begin prednisone burst/taper. COVID PCR pending. Followup with Family Doctor if not improved in about 10 days.   Final Clinical Impressions(s) / UC Diagnoses   Final diagnoses:  At increased risk of exposure to COVID-19 virus  Viral URI with cough     Discharge Instructions     Continue Mucinex-D, with plenty of water, for cough and congestion.  Get adequate rest.   May use Afrin nasal spray (or generic oxymetazoline) each morning for about 5 days and then discontinue.  Also recommend using saline nasal spray several times daily and saline nasal irrigation (AYR is a common brand).  Use Flonase nasal spray each morning after using Afrin nasal spray and saline nasal irrigation. Try warm salt water gargles for sore throat.  Stop all antihistamines for now, and other non-prescription cough/cold preparations. May take Delsym Cough Suppressant ("12 Hour Cough Relief") at bedtime for nighttime cough.   Isolate yourself until COVID-19 test result is available.   If your COVID19 test is positive, then you are infected with the novel coronavirus and could give the virus to others.  Please continue isolation at home for at least 10 days since the start of your symptoms.  Once you complete your 10 day quarantine, you may return to normal activities as long as you've not had a fever for over 24 hours (without taking fever reducing medicine) and your symptoms are improving. Please continue good preventive care measures,  including:  frequent hand-washing, avoid touching your face, cover coughs/sneezes, stay out of crowds and keep a 6 foot distance from others.  Go to the nearest hospital emergency room if fever/cough/breathlessness are severe or illness seems like a threat to life.     ED Prescriptions    Medication Sig Dispense Auth. Provider   predniSONE (DELTASONE) 20 MG tablet Take one tab by mouth twice daily for 4 days, then one daily for 3 days. Take with food. 11 tablet Lattie Haw, MD        Lattie Haw, MD 07/04/20 1250

## 2020-07-01 LAB — NOVEL CORONAVIRUS, NAA: SARS-CoV-2, NAA: NOT DETECTED

## 2020-07-01 LAB — SARS-COV-2, NAA 2 DAY TAT

## 2020-07-19 ENCOUNTER — Encounter: Payer: Self-pay | Admitting: Obstetrics & Gynecology

## 2020-07-19 ENCOUNTER — Ambulatory Visit (INDEPENDENT_AMBULATORY_CARE_PROVIDER_SITE_OTHER): Payer: 59 | Admitting: Obstetrics & Gynecology

## 2020-07-19 ENCOUNTER — Other Ambulatory Visit (HOSPITAL_COMMUNITY)
Admission: RE | Admit: 2020-07-19 | Discharge: 2020-07-19 | Disposition: A | Discharge status: Home / Self Care | Payer: 59 | Source: Ambulatory Visit | Attending: Obstetrics & Gynecology | Admitting: Obstetrics & Gynecology

## 2020-07-19 ENCOUNTER — Other Ambulatory Visit: Payer: Self-pay

## 2020-07-19 VITALS — BP 108/65 | HR 79 | Resp 16 | Ht 67.0 in | Wt 186.0 lb

## 2020-07-19 DIAGNOSIS — Z975 Presence of (intrauterine) contraceptive device: Secondary | ICD-10-CM

## 2020-07-19 DIAGNOSIS — N939 Abnormal uterine and vaginal bleeding, unspecified: Secondary | ICD-10-CM | POA: Insufficient documentation

## 2020-07-19 DIAGNOSIS — R5383 Other fatigue: Secondary | ICD-10-CM | POA: Insufficient documentation

## 2020-07-19 DIAGNOSIS — N921 Excessive and frequent menstruation with irregular cycle: Secondary | ICD-10-CM | POA: Insufficient documentation

## 2020-07-19 LAB — POCT URINE PREGNANCY: Preg Test, Ur: NEGATIVE

## 2020-07-19 MED ORDER — NATAZIA 3/2-2/2-3/1 MG PO TABS: 1.0000 | ORAL_TABLET | Freq: Every day | ORAL | 1 refills | Status: DC

## 2020-07-19 NOTE — Progress Notes (Signed)
   Subjective:    Patient ID: Monte Fantasia, female    DOB: Sep 12, 2000, 19 y.o.   MRN: [id number]  HPI  Pt presents for bleeding after new Nexplanon  Aug 25th Nexplanon changed Aug 29-Sept 7 Oct 12-Oct 31 Nov 12 Nov 16 Nov 22  Review of Systems  Constitutional: Negative.   Respiratory: Negative.   Cardiovascular: Negative.   Gastrointestinal: Negative.   Genitourinary: Positive for menstrual problem.  Psychiatric/Behavioral: Negative.        Objective:   Physical Exam Vitals reviewed.  Constitutional:      General: She is not in acute distress.    Appearance: She is well-developed.  HENT:     Head: Normocephalic and atraumatic.  Eyes:     Conjunctiva/sclera: Conjunctivae normal.  Cardiovascular:     Rate and Rhythm: Normal rate.  Pulmonary:     Effort: Pulmonary effort is normal.  Musculoskeletal:     Comments: Nexplanon in left arm (deeper than last Nexplanon but can be palpated.   Skin:    General: Skin is warm and dry.  Neurological:     Mental Status: She is alert and oriented to person, place, and time.    Vitals:   07/19/20 1347  BP: 108/65  Pulse: 79  Resp: 16  Weight: 186 lb (84.4 kg)  Height: 5\' 7"  (1.702 m)      Assessment & Plan:  19 yo female with bleeding on Nexplanon  1.  Similar to bleeding with first nexplanon.  Improved with brief course of OCP. 2.  Sexually active--uses condoms.  Will get STD screening 3.  Pap smear due March 2022 4.  CBC (felt very tired after bleeding)  30 mins spent reviewing records, exam, coordination of care and documentation.

## 2020-07-20 LAB — CBC
HCT: 38.7 % (ref 35.0–45.0)
Hemoglobin: 13.3 g/dL (ref 11.7–15.5)
MCH: 31.3 pg (ref 27.0–33.0)
MCHC: 34.4 g/dL (ref 32.0–36.0)
MCV: 91.1 fL (ref 80.0–100.0)
MPV: 11.9 fL (ref 7.5–12.5)
Platelets: 220 10*3/uL (ref 140–400)
RBC: 4.25 10*6/uL (ref 3.80–5.10)
RDW: 11.6 % (ref 11.0–15.0)
WBC: 6.6 10*3/uL (ref 3.8–10.8)

## 2020-07-20 LAB — URINE CYTOLOGY ANCILLARY ONLY
Chlamydia: NEGATIVE
Comment: NEGATIVE
Comment: NORMAL
Neisseria Gonorrhea: NEGATIVE

## 2020-12-01 ENCOUNTER — Ambulatory Visit (INDEPENDENT_AMBULATORY_CARE_PROVIDER_SITE_OTHER): Payer: 59 | Admitting: Obstetrics and Gynecology

## 2020-12-01 ENCOUNTER — Other Ambulatory Visit: Payer: Self-pay

## 2020-12-01 ENCOUNTER — Encounter: Payer: Self-pay | Admitting: Obstetrics and Gynecology

## 2020-12-01 VITALS — BP 117/70 | HR 105 | Ht 67.0 in | Wt 174.0 lb

## 2020-12-01 DIAGNOSIS — Z3046 Encounter for surveillance of implantable subdermal contraceptive: Secondary | ICD-10-CM | POA: Diagnosis not present

## 2020-12-01 MED ORDER — NATAZIA 3/2-2/2-3/1 MG PO TABS: 1.0000 | ORAL_TABLET | Freq: Every day | ORAL | 11 refills | Status: DC

## 2020-12-01 NOTE — Progress Notes (Signed)
     GYNECOLOGY OFFICE PROCEDURE NOTE  Tanya Pham is a 20 y.o. G0P0000 here for Nexplanon removal.  Last pap smear was on NA, due to schedule her first annual with pap.     Nexplanon Removal Patient identified, informed consent performed, consent signed.   Appropriate time out taken. Nexplanon site identified.  Area prepped in usual sterile fashon. One ml of 1% lidocaine was used to anesthetize the area at the distal end of the implant. A small stab incision was made right beside the implant on the distal portion.  The Nexplanon rod was grasped using hemostats and removed without difficulty.  There was minimal blood loss. There were no complications.  3 ml of 1% lidocaine was injected around the incision for post-procedure analgesia.  Steri-strips were applied over the small incision.  A pressure bandage was applied to reduce any bruising.  The patient tolerated the procedure well and was given post procedure instructions.  Patient is planning to use OCP's for contraception/attempt conception.   Erving Sassano, Harolyn Rutherford, NP [facility] for [employer], [facility]

## 2020-12-01 NOTE — Progress Notes (Signed)
Pt wants Nexplanon removed due to irregular bleeding. Has OCP Rx.

## 2022-03-21 NOTE — Progress Notes (Signed)
Last Mammogram: n/a Last Pap Smear:  never done Last Colon Screening;  n/a Seat Belts:   yes Sun Screen:   yes Dental Check Up:  yes Brush & Floss:  yes

## 2022-03-27 ENCOUNTER — Ambulatory Visit (INDEPENDENT_AMBULATORY_CARE_PROVIDER_SITE_OTHER): Payer: BC Managed Care – PPO | Admitting: Obstetrics & Gynecology

## 2022-03-27 ENCOUNTER — Encounter: Payer: Self-pay | Admitting: Obstetrics & Gynecology

## 2022-03-27 ENCOUNTER — Other Ambulatory Visit (HOSPITAL_COMMUNITY)
Admission: RE | Admit: 2022-03-27 | Discharge: 2022-03-27 | Disposition: A | Discharge status: Home / Self Care | Payer: BC Managed Care – PPO | Source: Ambulatory Visit | Attending: Obstetrics & Gynecology | Admitting: Obstetrics & Gynecology

## 2022-03-27 VITALS — BP 117/74 | HR 99 | Ht 67.0 in | Wt 187.0 lb

## 2022-03-27 DIAGNOSIS — Z01419 Encounter for gynecological examination (general) (routine) without abnormal findings: Secondary | ICD-10-CM | POA: Diagnosis present

## 2022-03-27 MED ORDER — NATAZIA 3/2-2/2-3/1 MG PO TABS: 1.0000 | ORAL_TABLET | Freq: Every day | ORAL | 11 refills | Status: DC

## 2022-03-27 NOTE — Progress Notes (Signed)
Subjective:     Tanya Pham is a 21 y.o. female here for a routine exam.  Current complaints: none.     Gynecologic History Patient's last menstrual period was 03/23/2022. Contraception: OCP (estrogen/progesterone) Last Pap: never.   Obstetric History OB History  Gravida Para Term Preterm AB Living  0 0 0 0 0 0  SAB IAB Ectopic Multiple Live Births  0 0 0 0 0     The following portions of the patient's history were reviewed and updated as appropriate: allergies, current medications, past family history, past medical history, past social history, past surgical history, and problem list.  Review of Systems Pertinent items noted in HPI and remainder of comprehensive ROS otherwise negative.    Objective:   Vitals:   03/27/22 1459  BP: 117/74  Pulse: 99  Weight: 187 lb (84.8 kg)  Height: 5\' 7"  (1.702 m)   Vitals:  WNL General appearance: alert, cooperative and no distress  HEENT: Normocephalic, without obvious abnormality, atraumatic Eyes: negative Throat: lips, mucosa, and tongue normal; teeth and gums normal  Respiratory: Clear to auscultation bilaterally  CV: Regular rate and rhythm  Breasts:  Normal appearance, no masses or tenderness, no nipple retraction or dimpling  GI: Soft, non-tender; bowel sounds normal; no masses,  no organomegaly  GU: External Genitalia:  Tanner V, no lesion Urethra:  No prolapse   Vagina: Pink, normal rugae, no blood or discharge  Cervix: No CMT, no lesion  Uterus:  Normal size and contour, non tender  Adnexa: Normal, no masses, non tender  Musculoskeletal: No edema, redness or tenderness in the calves or thighs  Skin: No lesions or rash  Lymphatic: Axillary adenopathy: none     Psychiatric: Normal mood and behavior         Assessment:    Healthy female exam.    Plan:    Pap with reflex testing Cultures Refill on OCPs

## 2022-03-29 LAB — CYTOLOGY - PAP
Chlamydia: NEGATIVE
Comment: NEGATIVE
Comment: NEGATIVE
Comment: NORMAL
Diagnosis: NEGATIVE
Neisseria Gonorrhea: NEGATIVE
Trichomonas: NEGATIVE

## 2022-09-05 ENCOUNTER — Other Ambulatory Visit: Payer: Self-pay | Admitting: Obstetrics & Gynecology

## 2022-09-05 MED ORDER — DESOGESTREL-ETHINYL ESTRADIOL 0.15-0.02/0.01 MG (21/5) PO TABS: 1.0000 | ORAL_TABLET | Freq: Every day | ORAL | 11 refills | Status: AC

## 2022-09-05 NOTE — Progress Notes (Signed)
Tanya Pham is no longer covered by insurance and Teretha would like something similar.  Mircette is the most similar and sent to [facility]

## 2022-10-31 ENCOUNTER — Encounter: Payer: Self-pay | Admitting: Obstetrics & Gynecology

## 2023-03-16 ENCOUNTER — Telehealth: Payer: Self-pay | Admitting: *Deleted

## 2023-03-16 NOTE — Telephone Encounter (Signed)
Patient will call back to schedule ?

## 2023-04-16 ENCOUNTER — Ambulatory Visit: Payer: BC Managed Care – PPO | Admitting: Obstetrics and Gynecology
